# Patient Record
Sex: Male | Born: 1970 | Race: Black or African American | Hispanic: No | Marital: Married | Smoking: Never smoker
Health system: Southern US, Community
[De-identification: ages and names within clinical notes are randomized; demographics above are authoritative.]

---

## 2019-05-07 ENCOUNTER — Other Ambulatory Visit: Payer: Self-pay

## 2019-05-07 ENCOUNTER — Emergency Department (HOSPITAL_COMMUNITY)
Admission: EM | Admit: 2019-05-07 | Discharge: 2019-05-07 | Disposition: A | Discharge status: Home / Self Care | Payer: BLUE CROSS/BLUE SHIELD | Attending: Emergency Medicine | Admitting: Emergency Medicine

## 2019-05-07 ENCOUNTER — Emergency Department (HOSPITAL_COMMUNITY): Payer: BLUE CROSS/BLUE SHIELD

## 2019-05-07 DIAGNOSIS — Y92410 Unspecified street and highway as the place of occurrence of the external cause: Secondary | ICD-10-CM | POA: Insufficient documentation

## 2019-05-07 DIAGNOSIS — S300XXA Contusion of lower back and pelvis, initial encounter: Secondary | ICD-10-CM | POA: Diagnosis not present

## 2019-05-07 DIAGNOSIS — S7002XA Contusion of left hip, initial encounter: Secondary | ICD-10-CM | POA: Diagnosis not present

## 2019-05-07 DIAGNOSIS — Y999 Unspecified external cause status: Secondary | ICD-10-CM | POA: Insufficient documentation

## 2019-05-07 DIAGNOSIS — Y9389 Activity, other specified: Secondary | ICD-10-CM | POA: Diagnosis not present

## 2019-05-07 DIAGNOSIS — S79912A Unspecified injury of left hip, initial encounter: Secondary | ICD-10-CM | POA: Diagnosis present

## 2019-05-07 NOTE — Discharge Instructions (Addendum)
Rest.  Ice for 20 minutes every 2 hours while awake for the next 2 days.  Ibuprofen 600 mg every 6 hours as needed for pain.  Return to the emergency department for worsening pain, dizziness, abdominal pain, or other new and concerning symptoms.
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
## 2020-01-30 ENCOUNTER — Other Ambulatory Visit: Payer: Self-pay

## 2020-01-30 ENCOUNTER — Encounter (HOSPITAL_COMMUNITY): Payer: Self-pay

## 2020-01-30 ENCOUNTER — Ambulatory Visit (HOSPITAL_COMMUNITY)
Admission: EM | Admit: 2020-01-30 | Discharge: 2020-01-30 | Disposition: A | Discharge status: Home / Self Care | Payer: BLUE CROSS/BLUE SHIELD | Attending: Emergency Medicine | Admitting: Emergency Medicine

## 2020-01-30 DIAGNOSIS — Z20822 Contact with and (suspected) exposure to covid-19: Secondary | ICD-10-CM | POA: Diagnosis not present

## 2020-01-30 DIAGNOSIS — Z0000 Encounter for general adult medical examination without abnormal findings: Secondary | ICD-10-CM

## 2020-01-30 LAB — SARS CORONAVIRUS 2 (TAT 6-24 HRS): SARS Coronavirus 2: NEGATIVE

## 2020-01-30 NOTE — ED Triage Notes (Signed)
Pt presents for covid testing for travel on Sunday.

## 2020-01-30 NOTE — Discharge Instructions (Addendum)
Return here in 24 hours to get your test results.

## 2020-01-30 NOTE — ED Provider Notes (Signed)
HPI  SUBJECTIVE:  Aaron Wong is a 49 y.o. male who presents for Covid testing prior to travelling to [place] on Sunday.  He denies symptoms.  No fevers, body aches, headaches nasal congestion, sore throat, cough, shortness of breath, nausea, vomiting, diarrhea, abdominal pain.  No known Covid exposure.  He has not yet gotten a Covid vaccine.  Past medical history negative for coronary artery disease, chronic kidney disease, diabetes, hypertension, HIV, immunocompromise or cancer.    History reviewed. No pertinent past medical history.  History reviewed. No pertinent surgical history.  Family History  Family history unknown: Yes    Social History   Tobacco Use  . Smoking status: Never Smoker  Substance Use Topics  . Alcohol use: Not on file  . Drug use: Not on file    No current facility-administered medications for this encounter. No current outpatient medications on file.  No Known Allergies   ROS  As noted in HPI.   Physical Exam  BP (!) 117/98 (BP Location: Left Arm)   Pulse 78   Temp 98.3 F (36.8 C) (Oral)   Resp 18   SpO2 100%   Constitutional: Well developed, well nourished, no acute distress Eyes:  EOMI, conjunctiva normal bilaterally HENT: Normocephalic, atraumatic,mucus membranes moist Respiratory: Normal inspiratory effort lungs clear bilaterally Cardiovascular: Normal rate regular rhythm no murmurs rubs or gallops GI: nondistended skin: No rash, skin intact Musculoskeletal: no deformities Neurologic: Alert & oriented x 3, no focal neuro deficits Psychiatric: Speech and behavior appropriate   ED Course   Medications - No data to display  Orders Placed This Encounter  Procedures  . SARS CORONAVIRUS 2 (TAT 6-24 HRS) Nasopharyngeal Nasopharyngeal Swab    Standing Status:   Standing    Number of Occurrences:   1    Order Specific Question:   Is this test for diagnosis or screening    Answer:   Screening    Order Specific Question:    Symptomatic for COVID-19 as defined by CDC    Answer:   No    Order Specific Question:   Hospitalized for COVID-19    Answer:   No    Order Specific Question:   Admitted to ICU for COVID-19    Answer:   No    Order Specific Question:   Previously tested for COVID-19    Answer:   No    Order Specific Question:   Resident in a congregate (group) care setting    Answer:   No    Order Specific Question:   Employed in healthcare setting    Answer:   No    Order Specific Question:   Has patient completed COVID vaccination(s) (2 doses of Pfizer/Moderna 1 dose of [employer])    Answer:   No    No results found for this or any previous visit (from the past 24 hour(s)). No results found.  ED Clinical Impression  1. Encounter for laboratory testing for COVID-19 virus   2. Normal physical exam      ED Assessment/Plan  Covid PCR sent.  Patient will return here tomorrow to pick up result. Discussed with patient that we will notify him if it returns positive.  No orders of the defined types were placed in this encounter.   *This clinic note was created using Dragon dictation software. Therefore, there may be occasional mistakes despite careful proofreading.   ?    Domenick Gong, MD 01/30/20 1108
